# Patient Record
Sex: Male | Born: 1935 | Race: Black or African American | Hispanic: No | Marital: Married
Health system: Southern US, Community
[De-identification: ages and names within clinical notes are randomized; demographics above are authoritative.]

---

## 2003-10-14 ENCOUNTER — Ambulatory Visit (HOSPITAL_COMMUNITY): Admission: RE | Admit: 2003-10-14 | Discharge: 2003-10-14 | Payer: Self-pay | Admitting: Sports Medicine

## 2003-10-14 ENCOUNTER — Encounter: Admission: RE | Admit: 2003-10-14 | Discharge: 2003-10-14 | Payer: Self-pay | Admitting: Sports Medicine

## 2003-10-16 ENCOUNTER — Encounter: Admission: RE | Admit: 2003-10-16 | Discharge: 2003-10-16 | Payer: Self-pay | Admitting: Sports Medicine

## 2003-10-21 ENCOUNTER — Encounter: Admission: RE | Admit: 2003-10-21 | Discharge: 2003-10-21 | Payer: Self-pay | Admitting: Family Medicine

## 2003-10-28 ENCOUNTER — Encounter: Admission: RE | Admit: 2003-10-28 | Discharge: 2003-10-28 | Payer: Self-pay | Admitting: Sports Medicine

## 2004-01-13 ENCOUNTER — Encounter: Admission: RE | Admit: 2004-01-13 | Discharge: 2004-01-13 | Payer: Self-pay | Admitting: Family Medicine

## 2004-03-15 ENCOUNTER — Inpatient Hospital Stay (HOSPITAL_COMMUNITY): Admission: RE | Admit: 2004-03-15 | Discharge: 2004-03-19 | Payer: Self-pay | Admitting: Orthopedic Surgery

## 2004-04-19 ENCOUNTER — Ambulatory Visit: Payer: Self-pay | Admitting: Sports Medicine

## 2004-07-28 ENCOUNTER — Ambulatory Visit: Payer: Self-pay | Admitting: Family Medicine

## 2004-08-03 ENCOUNTER — Ambulatory Visit (HOSPITAL_COMMUNITY): Admission: RE | Admit: 2004-08-03 | Discharge: 2004-08-03 | Payer: Self-pay | Admitting: Sports Medicine

## 2004-08-31 ENCOUNTER — Ambulatory Visit (HOSPITAL_COMMUNITY): Admission: RE | Admit: 2004-08-31 | Discharge: 2004-08-31 | Payer: Self-pay | Admitting: Vascular Surgery

## 2004-09-15 ENCOUNTER — Inpatient Hospital Stay (HOSPITAL_BASED_OUTPATIENT_CLINIC_OR_DEPARTMENT_OTHER): Admission: RE | Admit: 2004-09-15 | Discharge: 2004-09-15 | Payer: Self-pay | Admitting: Cardiology

## 2004-11-25 ENCOUNTER — Ambulatory Visit: Payer: Self-pay | Admitting: Family Medicine

## 2004-12-28 ENCOUNTER — Ambulatory Visit: Payer: Self-pay | Admitting: Sports Medicine

## 2004-12-30 ENCOUNTER — Ambulatory Visit (HOSPITAL_COMMUNITY): Admission: RE | Admit: 2004-12-30 | Discharge: 2004-12-30 | Payer: Self-pay | Admitting: Sports Medicine

## 2005-01-17 ENCOUNTER — Ambulatory Visit: Payer: Self-pay | Admitting: Family Medicine

## 2005-01-28 ENCOUNTER — Encounter: Admission: RE | Admit: 2005-01-28 | Discharge: 2005-01-28 | Payer: Self-pay | Admitting: Sports Medicine

## 2005-01-28 ENCOUNTER — Ambulatory Visit: Payer: Self-pay | Admitting: Family Medicine

## 2005-02-01 ENCOUNTER — Ambulatory Visit: Payer: Self-pay | Admitting: Family Medicine

## 2005-02-04 ENCOUNTER — Encounter: Admission: RE | Admit: 2005-02-04 | Discharge: 2005-02-04 | Payer: Self-pay | Admitting: Family Medicine

## 2005-02-07 ENCOUNTER — Ambulatory Visit: Payer: Self-pay | Admitting: Family Medicine

## 2005-02-11 ENCOUNTER — Ambulatory Visit: Payer: Self-pay | Admitting: Oncology

## 2005-02-15 ENCOUNTER — Ambulatory Visit (HOSPITAL_COMMUNITY): Admission: RE | Admit: 2005-02-15 | Discharge: 2005-02-15 | Payer: Self-pay | Admitting: *Deleted

## 2005-02-22 ENCOUNTER — Ambulatory Visit (HOSPITAL_COMMUNITY): Admission: RE | Admit: 2005-02-22 | Discharge: 2005-02-22 | Payer: Self-pay | Admitting: Oncology

## 2005-03-03 ENCOUNTER — Ambulatory Visit (HOSPITAL_COMMUNITY): Admission: RE | Admit: 2005-03-03 | Discharge: 2005-03-03 | Payer: Self-pay | Admitting: Oncology

## 2005-03-08 ENCOUNTER — Ambulatory Visit (HOSPITAL_COMMUNITY): Admission: RE | Admit: 2005-03-08 | Discharge: 2005-03-08 | Payer: Self-pay | Admitting: Oncology

## 2005-03-31 ENCOUNTER — Ambulatory Visit: Payer: Self-pay | Admitting: Oncology

## 2005-04-18 ENCOUNTER — Ambulatory Visit (HOSPITAL_COMMUNITY): Admission: RE | Admit: 2005-04-18 | Discharge: 2005-04-18 | Payer: Self-pay | Admitting: Oncology

## 2005-05-09 ENCOUNTER — Inpatient Hospital Stay (HOSPITAL_COMMUNITY): Admission: EM | Admit: 2005-05-09 | Discharge: 2005-05-16 | Payer: Self-pay | Admitting: Oncology

## 2005-05-10 ENCOUNTER — Ambulatory Visit: Payer: Self-pay | Admitting: Oncology

## 2005-05-20 ENCOUNTER — Ambulatory Visit: Payer: Self-pay | Admitting: Family Medicine

## 2005-05-20 ENCOUNTER — Ambulatory Visit: Payer: Self-pay | Admitting: Oncology

## 2005-07-09 ENCOUNTER — Inpatient Hospital Stay (HOSPITAL_COMMUNITY): Admission: AD | Admit: 2005-07-09 | Discharge: 2005-07-09 | Payer: Self-pay | Admitting: Oncology

## 2005-07-09 ENCOUNTER — Ambulatory Visit: Payer: Self-pay | Admitting: Oncology

## 2006-10-27 IMAGING — CT CT PELVIS W/ CM
1 of 4 series · 12 of 32 positions shown, 17 images · IV contrast (omnipaque)
Comparison: 02/15/05.

***DUPLICATE COPY for exam association in [facility] -- No change from original report, 04/21/05.***
CLINICAL DATA: Followup gastric carcinoma.  Radiation therapy completed.  Abdominal and pelvic pain and constipation.  History of seed implant. 
 CHEST CT WITH CONTRAST:
TECHNIQUE: Multidetector CT imaging of the chest was performed following the standard protocol during bolus administration of intravenous contrast.
 Contrast:  Oral contrast and 125 cc Omnipaque 300
TECHNIQUE: Multidetector CT imaging of the abdomen was performed following the standard protocol during bolus administration of intravenous contrast.
TECHNIQUE: Multidetector CT imaging of the pelvis was performed following the standard protocol during bolus administration of intravenous contrast.

[Series 2: cap w/iv 5.0 b30f · axial · 0.85mm/px · z∈[-647,-62]mm · 12 of 135 slices shown, 17 images]
[im 9/135  soft-tissue]
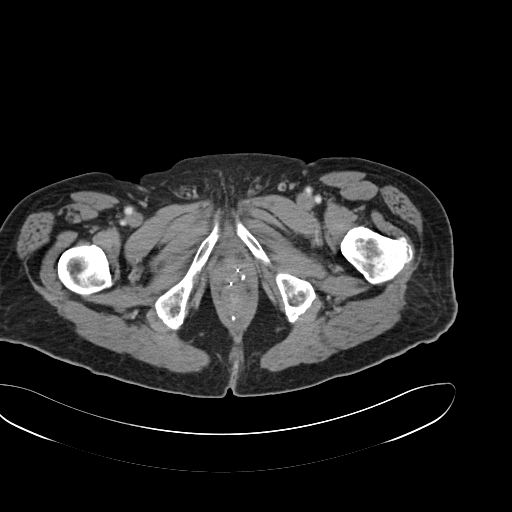
[im 9/135  bone]
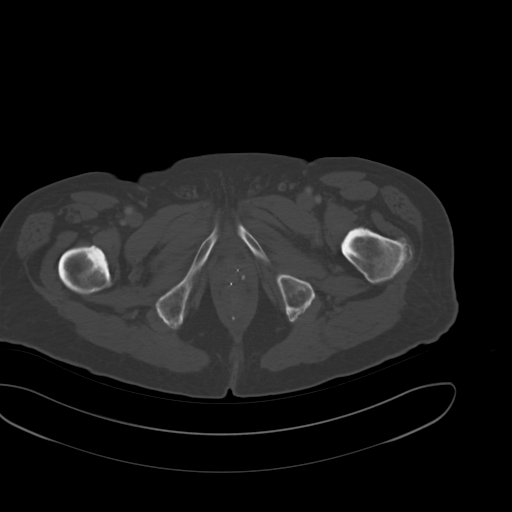
[im 26/135  soft-tissue]
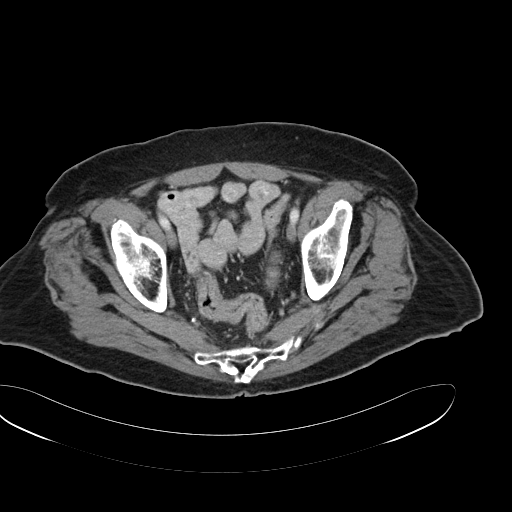
[im 34/135  soft-tissue]
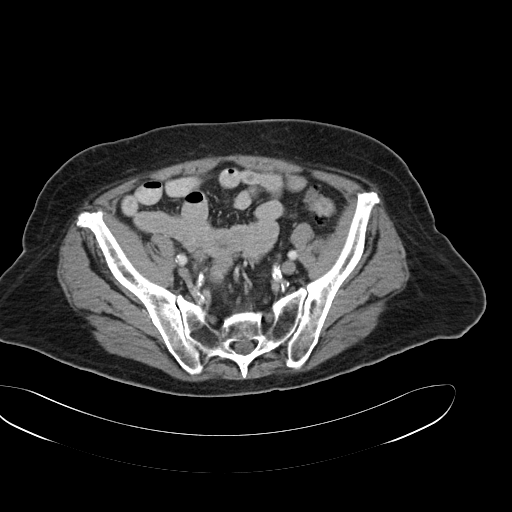
[im 42/135  soft-tissue]
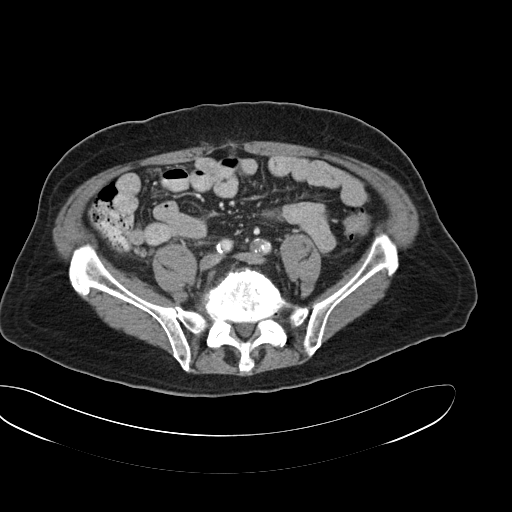
[im 59/135  soft-tissue]
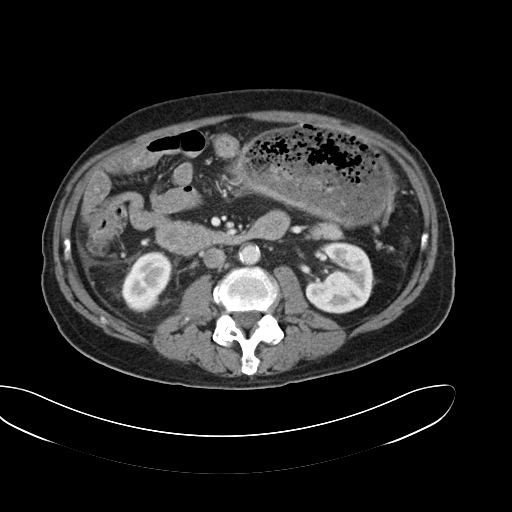
[im 68/135  soft-tissue]
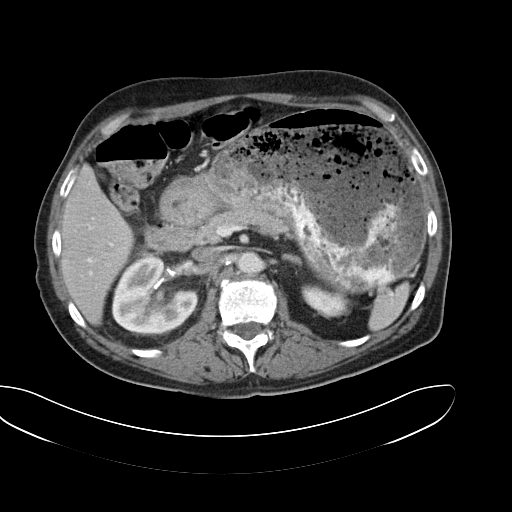
[im 76/135  soft-tissue]
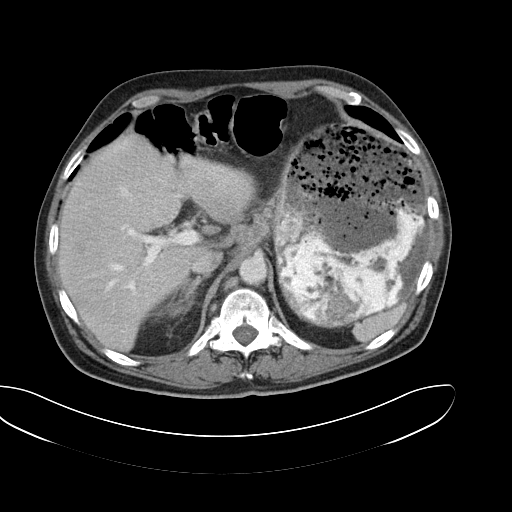
[im 93/135  soft-tissue]
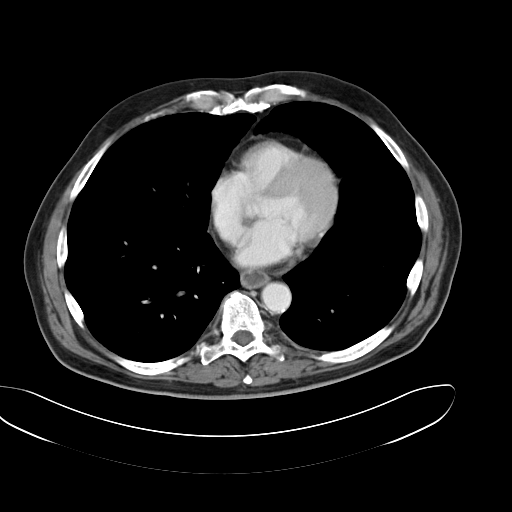
[im 101/135  soft-tissue]
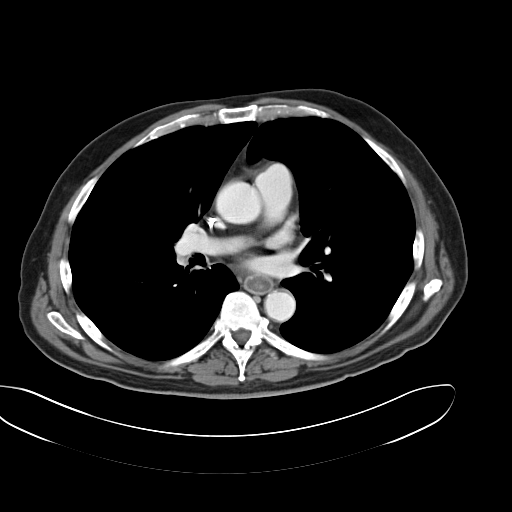
[im 101/135  lung]
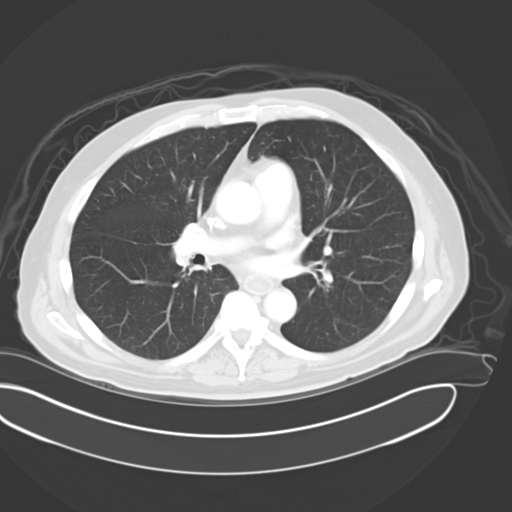
[im 101/135  bone]
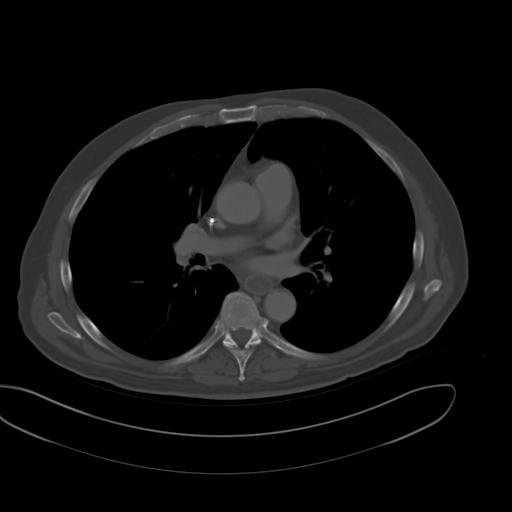
[im 109/135  soft-tissue]
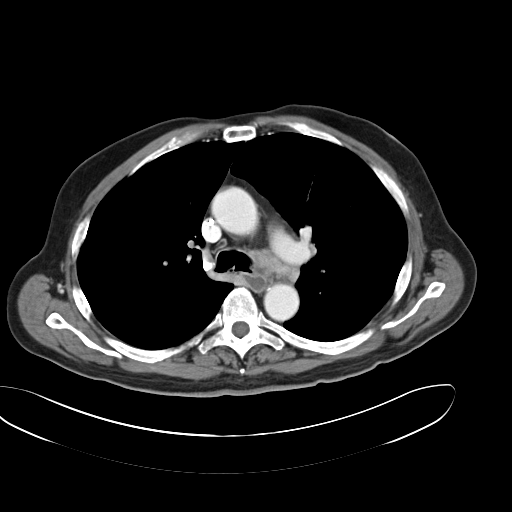
[im 109/135  lung]
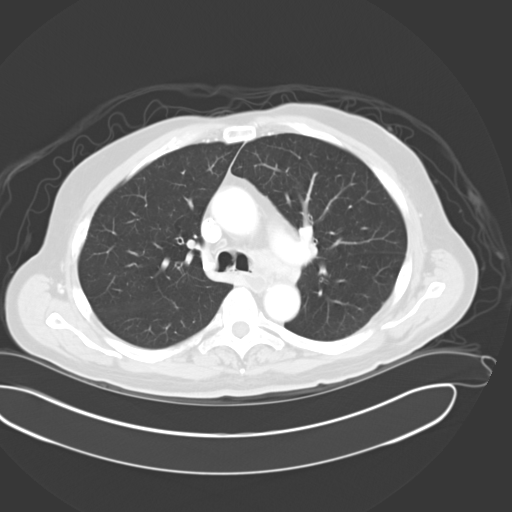
[im 118/135  lung]
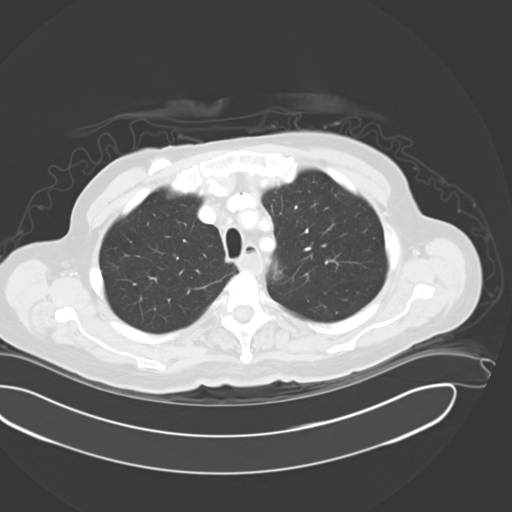
[im 126/135  soft-tissue]
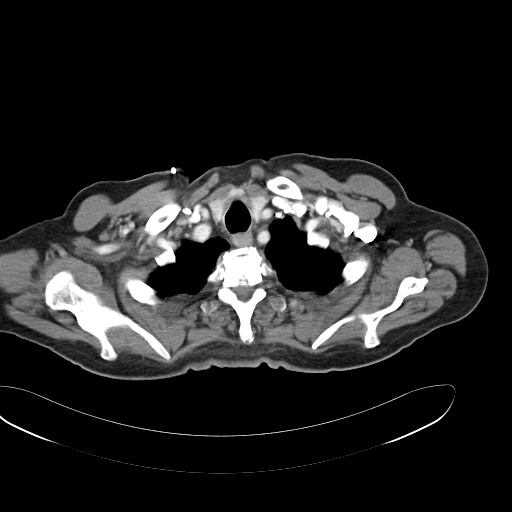
[im 126/135  lung]
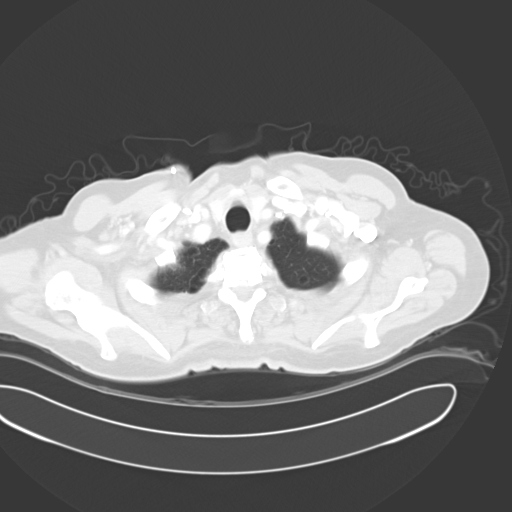

[12 of 32 positions shown; findings below may reference images not displayed]

FINDINGS: New filling defect at the pulmonary artery bifurcation extending into the right and left main pulmonary arteries is compatible with pulmonary embolus of uncertain chronicity.  Enlarged pre-vascular and AP window lymph nodes are stable to minimally decreased since the prior study.  Fluid-filled esophagus is noted.  No new enlarged lymph nodes are identified.  There are no pleural or pericardial effusions present.  The lungs are clear except for a stable tiny right lower lobe nodule (image 37).  A right Port-A-Cath is noted.
IMPRESSION: 1.  New central pulmonary emboli since the prior study - 02/15/05.  
 2.  Stable to slightly decreasing mediastinal adenopathy.  
 3.  Fluid filled mildly distended esophagus.  
 ABDOMEN CT WITH CONTRAST:
FINDINGS: The stomach is distended with contrast and food - likely gastric outlet obstruction.  Correlate clinically.  Multiple low density hepatic lesions are stable from the prior study.  The remainder of the liver is unremarkable.  The spleen is diminutive and unchanged.  The pancreas, left adrenal gland, and gallbladder are unremarkable.  New 2 x 1.7 cm ill defined lesion involving the right adrenal gland (image 61 through 64) likely represents a new metastasis.  Enlarged lymph nodes in the gastrohepatic ligament and the celiac axis have slightly decreased in size with the index node measuring 4.7 x 2.1 cm (image 61, previously measuring 6.5 x 2.5 cm.  Other lymph nodes in this region however appear unchanged.  Standing in the retroperitoneum is stable.  Surgical clips in the region of the right renal sinus identified as well as small right renal cyst.  No other changes are identified.  No evidence of free fluid.
IMPRESSION: 1.  New right adrenal lesion worrisome for new metastasis.  
 2.  Stable to slightly decreasing gastrohepatic ligament/celiac lymphadenopathy. 
 3.  Stable hepatic lesions.  
 4.  Gastric distention - likely some degree of gastric outlet obstruction. 
 PELVIS CT WITH CONTRAST:
FINDINGS: Bladder and bowel are within normal limits.  Seeds within the prostate are noted.  No free fluid or enlarged lymph nodes.  The appendix is normal.  Large amount of atherosclerotic plaque in the proximal left common iliac artery is unchanged.  
 Bones:  Degenerative changes throughout the lumbar spine and hips noted.  No suspicious focal bony lesions are identified.
IMPRESSION: 1.  No evidence of metastatic disease in the pelvis. 
 2.  Prostate seeds.  
 Aujla, Jaylon for Dr. Alex Zamuel was notified of these results on 04/18/05 at [DATE] p.m.

## 2006-11-17 IMAGING — CR DG ABD PORTABLE 1V
1 series · 1 of 1 positions shown · non-contrast
Comparison: 02/04/05.

CLINICAL DATA: 69-year-old with dehydration.  History of   gastric cancer.  Vomiting.  
 ONE VIEW ABDOMEN PORTABLE:

[view not recorded]
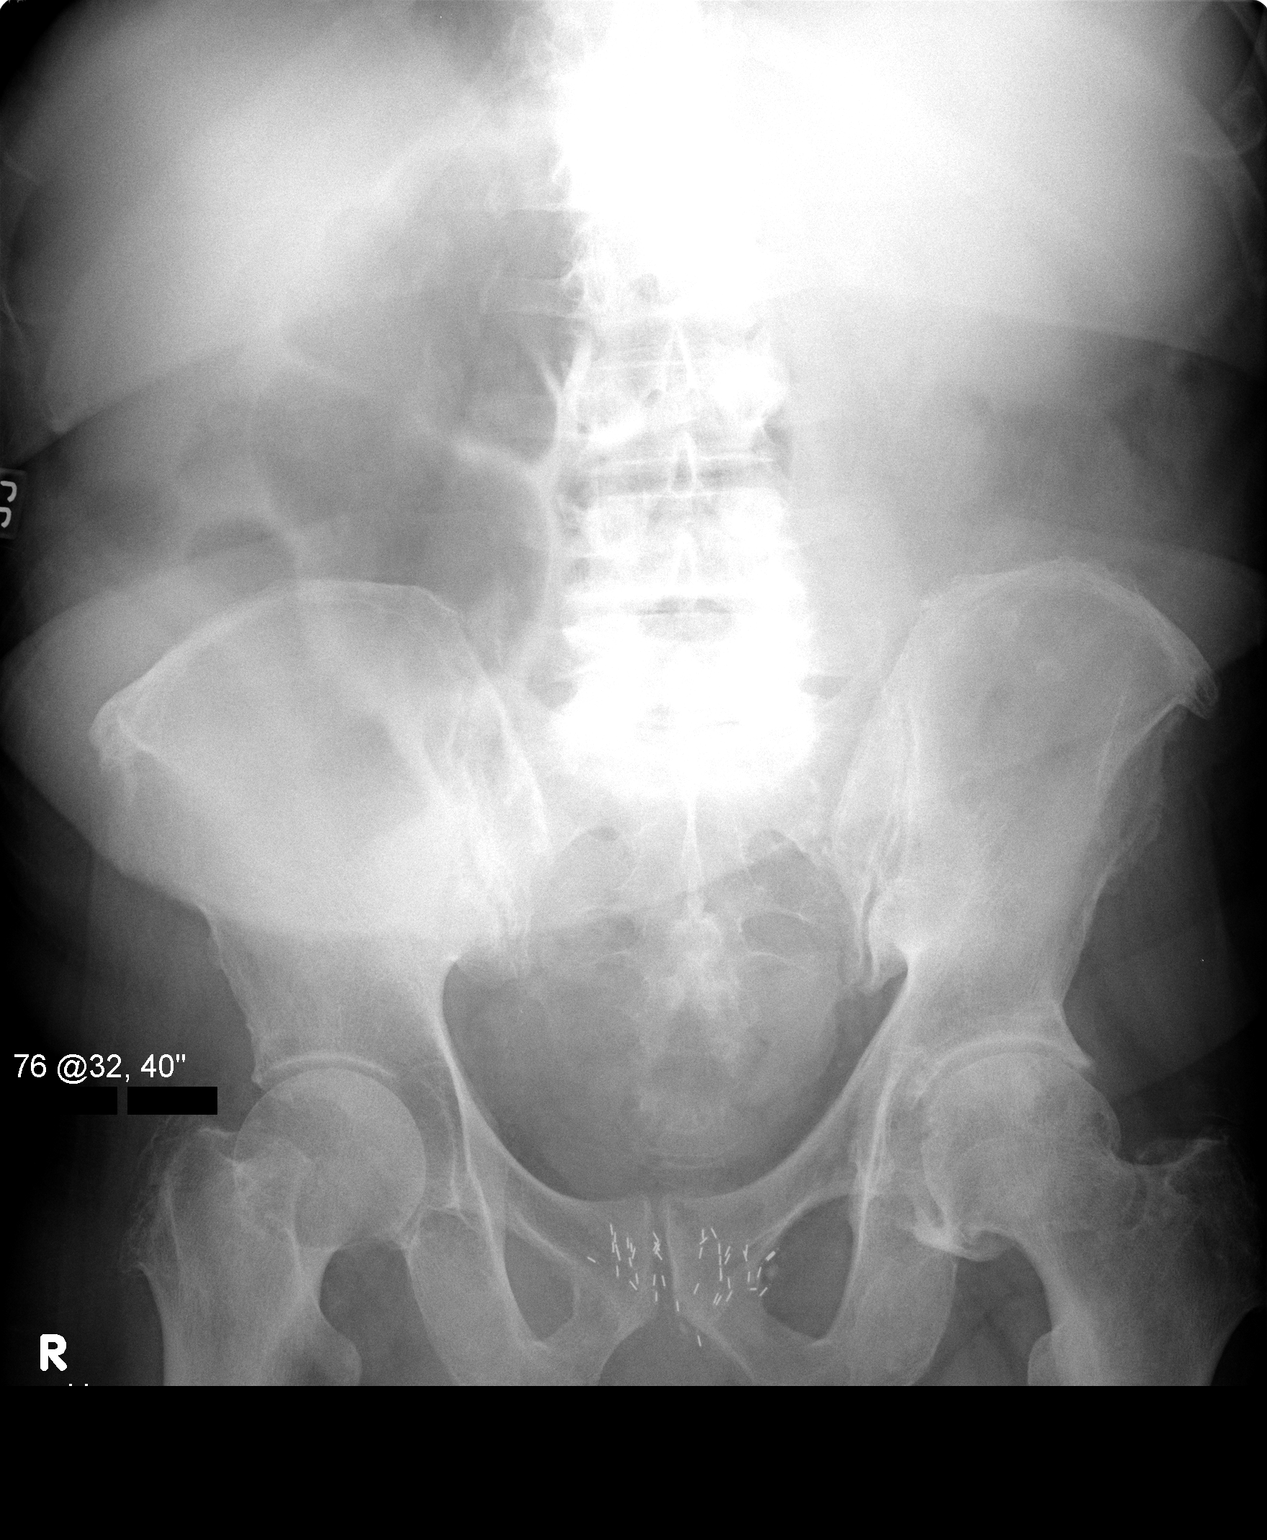

[1 of 1 positions shown; findings below may reference images not displayed]

FINDINGS: There is some air in the right colon otherwise, there is a relative paucity of bowel gas.  I do not see any definite gross free air.  The soft tissue shadows of the abdomen are grossly maintained.  No worrisome calcifications are seen.  Stable degenerative changes in the spine.  Prostate seeds are again noted.
IMPRESSION: 1.  Unremarkable 1 view abdomen.  No plain film findings for obstruction or perforation.

## 2006-11-19 IMAGING — CR DG CHEST 2V
2 series · 2 of 2 positions shown · non-contrast
Comparison: 05/09/05.

CLINICAL DATA: Stomach cancer.  Cough.  Congestion.  Evaluation for aspiration.  
 CHEST - 2 VIEWS:

[w chest lat *]
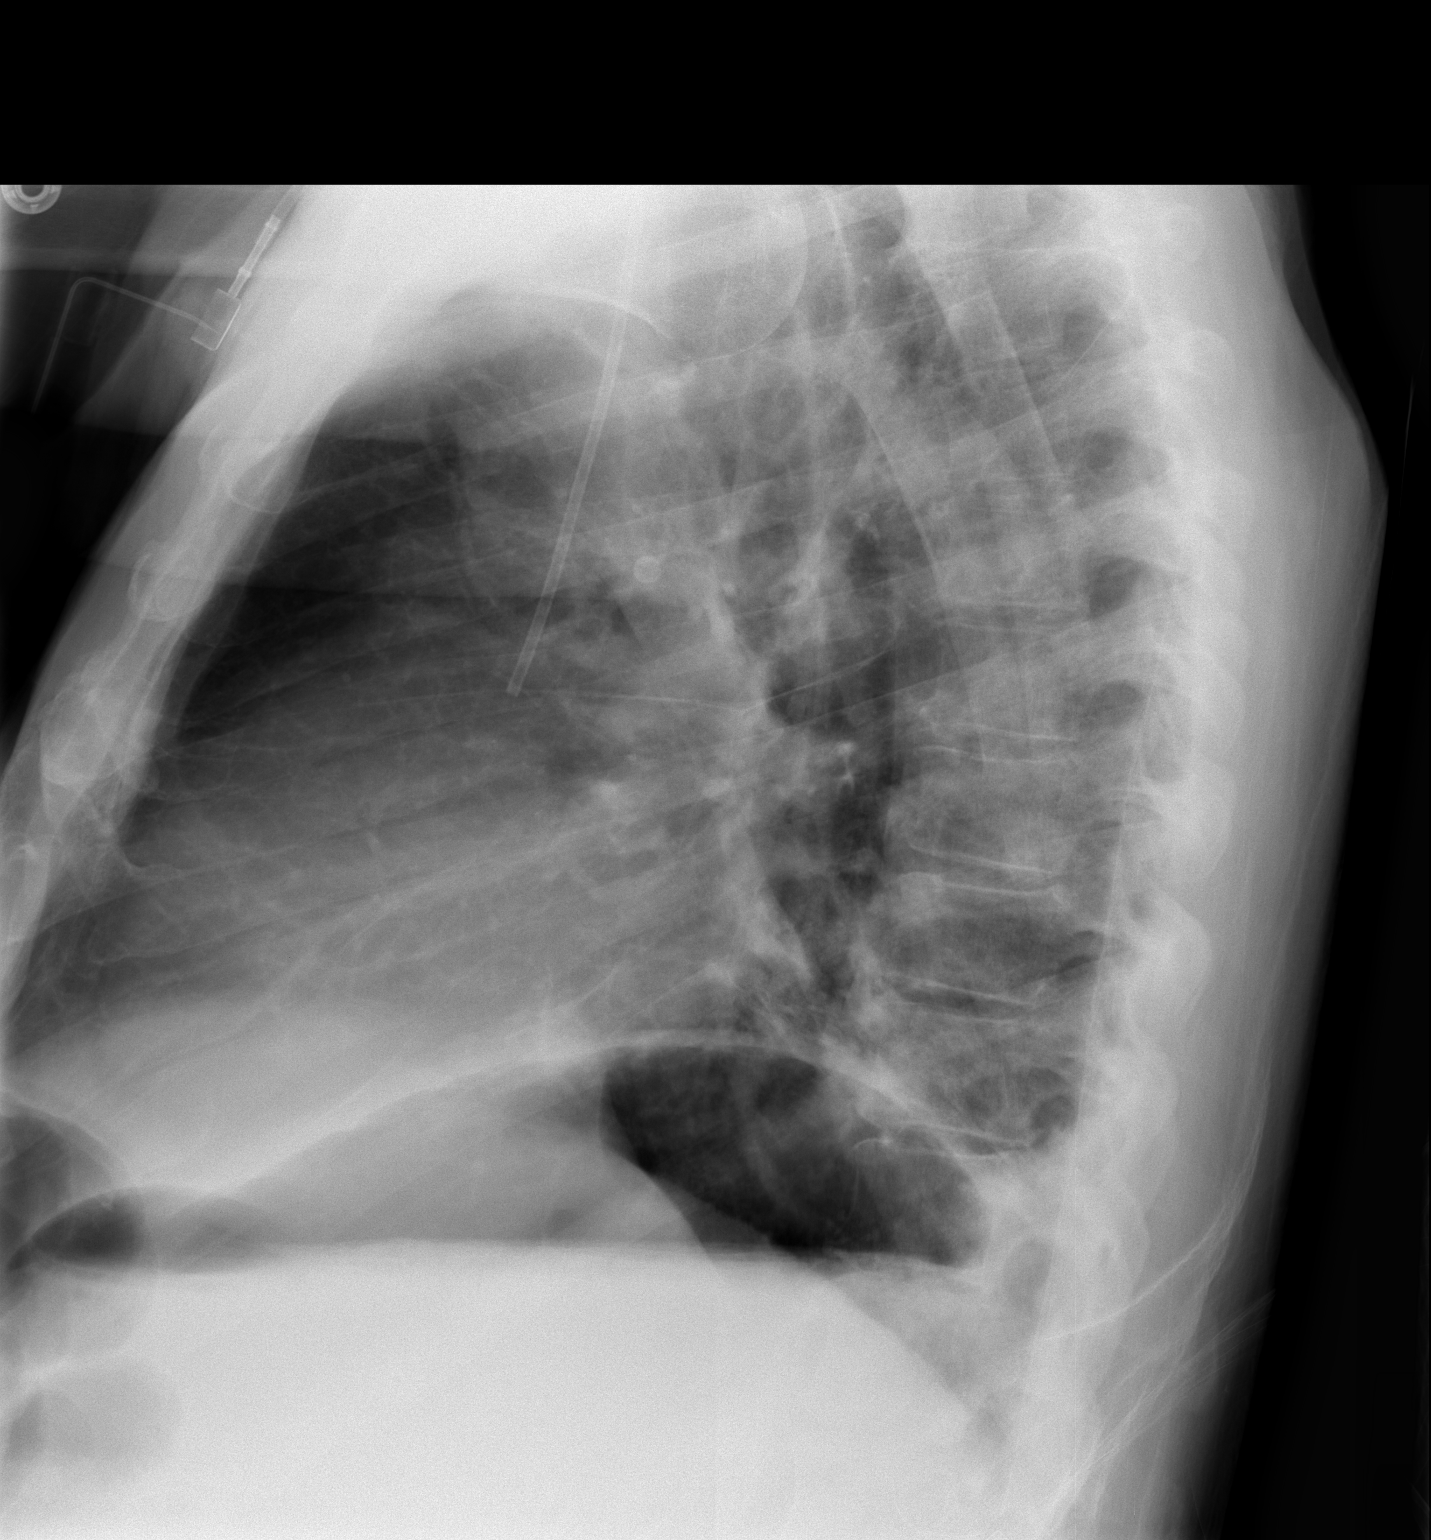

[view not recorded]
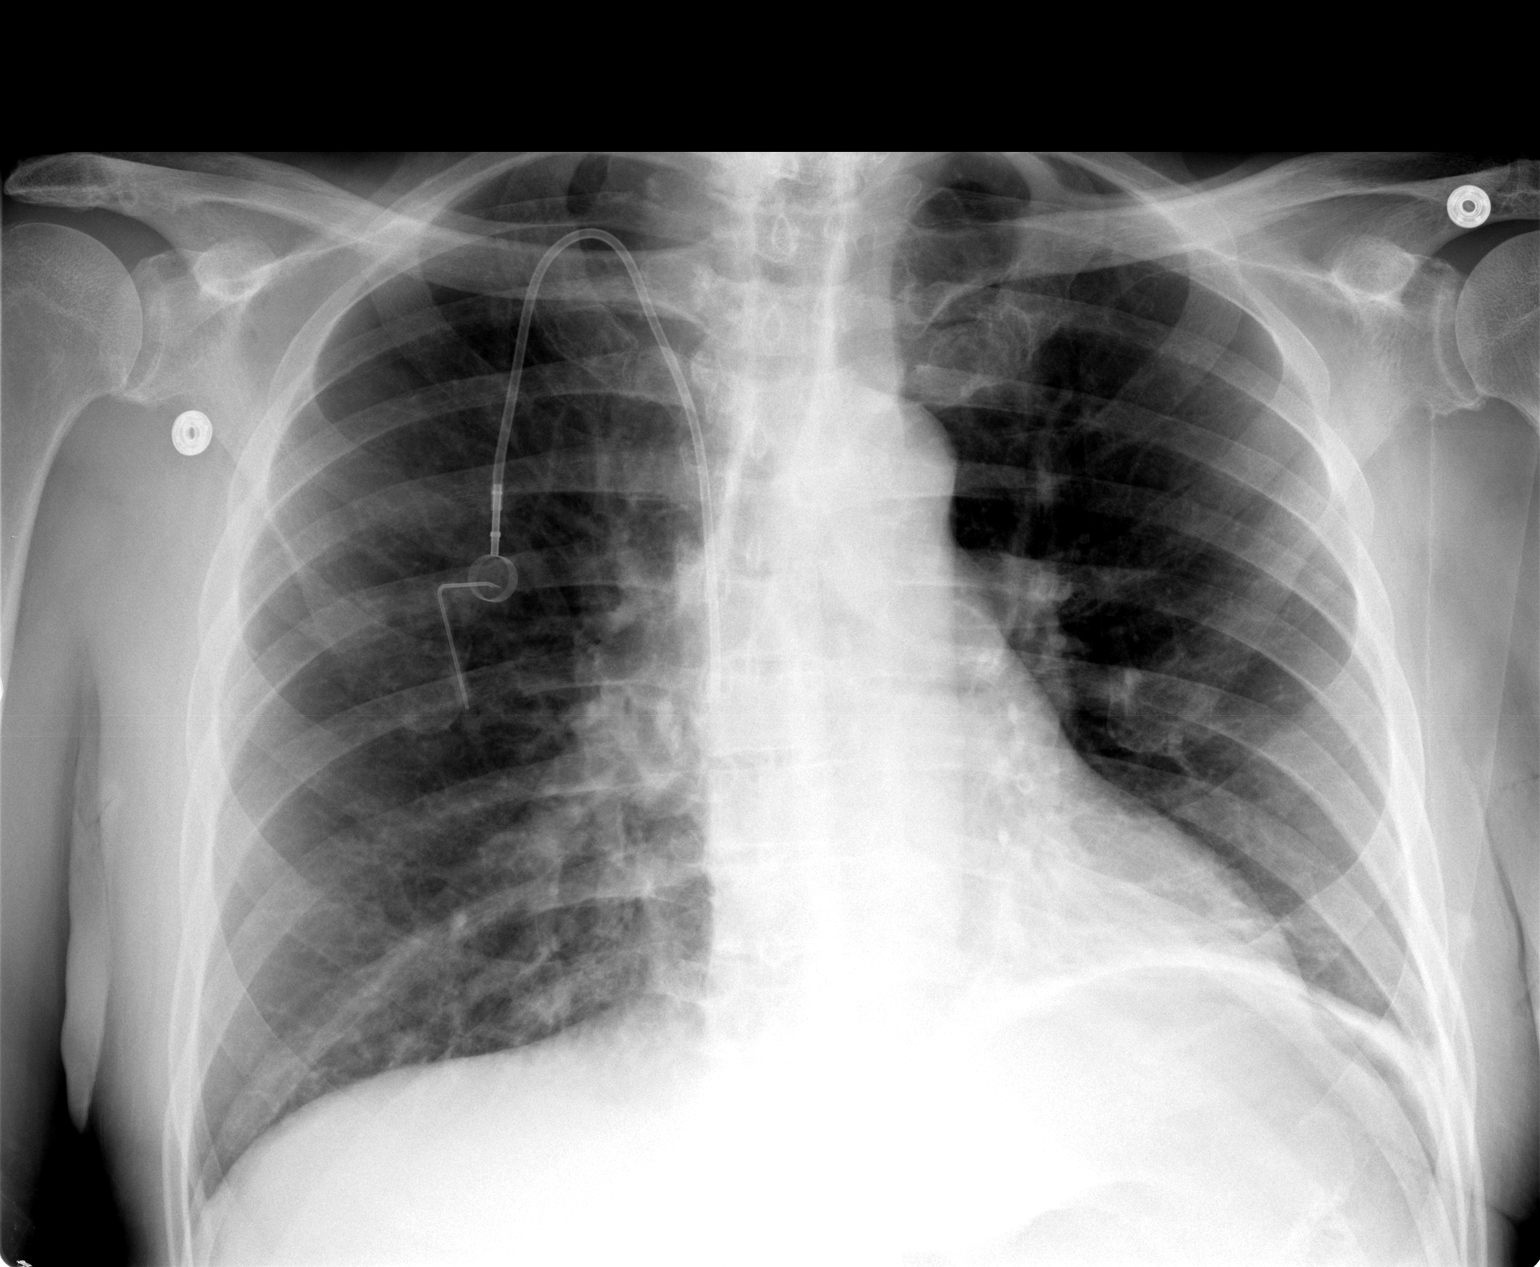

[2 of 2 positions shown; findings below may reference images not displayed]

FINDINGS: There is a right-sided port-a-cath with tip in the projection of the SVC.  The heart size is normal.  There are no effusions.  No evidence for pulmonary edema.  
 New medial right lower lobe opacity consistent with atelectasis/consolidation versus aspiration.
IMPRESSION: New medial right lung base opacity consistent with aspiration.

## 2013-04-03 DEATH — deceased

## 2013-07-18 ENCOUNTER — Other Ambulatory Visit: Payer: Self-pay | Admitting: *Deleted

## 2015-05-14 ENCOUNTER — Telehealth: Payer: Self-pay | Admitting: *Deleted

## 2015-05-18 NOTE — Telephone Encounter (Signed)
error
